# Patient Record
Sex: Female | Born: 1995 | Race: White | Hispanic: No | Marital: Married | Smoking: Never smoker
Health system: Southern US, Community
[De-identification: ages and names within clinical notes are randomized; demographics above are authoritative.]

## PROBLEM LIST (undated history)

## (undated) DIAGNOSIS — N809 Endometriosis, unspecified: Secondary | ICD-10-CM

---

## 2017-07-11 ENCOUNTER — Observation Stay (HOSPITAL_COMMUNITY)
Admission: EM | Admit: 2017-07-11 | Discharge: 2017-07-12 | Disposition: A | Discharge status: Home / Self Care | Payer: BLUE CROSS/BLUE SHIELD | Attending: Surgery | Admitting: Surgery

## 2017-07-11 ENCOUNTER — Observation Stay (HOSPITAL_COMMUNITY): Payer: BLUE CROSS/BLUE SHIELD | Admitting: Certified Registered Nurse Anesthetist

## 2017-07-11 ENCOUNTER — Encounter (HOSPITAL_COMMUNITY): Payer: Self-pay

## 2017-07-11 ENCOUNTER — Encounter (HOSPITAL_COMMUNITY)
Admission: EM | Disposition: A | Discharge status: Home / Self Care | Payer: Self-pay | Source: Home / Self Care | Attending: Emergency Medicine

## 2017-07-11 ENCOUNTER — Other Ambulatory Visit: Payer: Self-pay

## 2017-07-11 ENCOUNTER — Emergency Department (HOSPITAL_COMMUNITY): Payer: BLUE CROSS/BLUE SHIELD

## 2017-07-11 DIAGNOSIS — K35891 Other acute appendicitis without perforation, with gangrene: Principal | ICD-10-CM | POA: Insufficient documentation

## 2017-07-11 DIAGNOSIS — K3580 Unspecified acute appendicitis: Secondary | ICD-10-CM | POA: Diagnosis present

## 2017-07-11 DIAGNOSIS — K37 Unspecified appendicitis: Secondary | ICD-10-CM | POA: Diagnosis present

## 2017-07-11 DIAGNOSIS — R1012 Left upper quadrant pain: Secondary | ICD-10-CM | POA: Diagnosis present

## 2017-07-11 HISTORY — DX: Endometriosis, unspecified: N80.9

## 2017-07-11 HISTORY — PX: LAPAROSCOPIC APPENDECTOMY: SHX408

## 2017-07-11 LAB — CBC
HCT: 39.7 % (ref 36.0–46.0)
HEMOGLOBIN: 13.6 g/dL (ref 12.0–15.0)
MCH: 28.5 pg (ref 26.0–34.0)
MCHC: 34.3 g/dL (ref 30.0–36.0)
MCV: 83.1 fL (ref 78.0–100.0)
Platelets: 211 10*3/uL (ref 150–400)
RBC: 4.78 MIL/uL (ref 3.87–5.11)
RDW: 13 % (ref 11.5–15.5)
WBC: 17 10*3/uL — ABNORMAL HIGH (ref 4.0–10.5)

## 2017-07-11 LAB — URINALYSIS, ROUTINE W REFLEX MICROSCOPIC
Bilirubin Urine: NEGATIVE
GLUCOSE, UA: NEGATIVE mg/dL
HGB URINE DIPSTICK: NEGATIVE
Ketones, ur: 5 mg/dL — AB
NITRITE: NEGATIVE
Protein, ur: 30 mg/dL — AB
Specific Gravity, Urine: 1.029 (ref 1.005–1.030)
pH: 7 (ref 5.0–8.0)

## 2017-07-11 LAB — COMPREHENSIVE METABOLIC PANEL
ALBUMIN: 4.1 g/dL (ref 3.5–5.0)
ALK PHOS: 65 U/L (ref 38–126)
ALT: 14 U/L (ref 14–54)
ANION GAP: 12 (ref 5–15)
AST: 19 U/L (ref 15–41)
BILIRUBIN TOTAL: 0.9 mg/dL (ref 0.3–1.2)
BUN: 8 mg/dL (ref 6–20)
CALCIUM: 9.5 mg/dL (ref 8.9–10.3)
CO2: 22 mmol/L (ref 22–32)
Chloride: 103 mmol/L (ref 101–111)
Creatinine, Ser: 0.54 mg/dL (ref 0.44–1.00)
GFR calc Af Amer: >60 mL/min (ref >60)
GLUCOSE: 140 mg/dL — AB (ref 65–99)
Potassium: 4 mmol/L (ref 3.5–5.1)
Sodium: 137 mmol/L (ref 135–145)
TOTAL PROTEIN: 7.4 g/dL (ref 6.5–8.1)

## 2017-07-11 LAB — I-STAT BETA HCG BLOOD, ED (MC, WL, AP ONLY): I-stat hCG, quantitative: <5 m[IU]/mL (ref <5)

## 2017-07-11 LAB — LIPASE, BLOOD: Lipase: 32 U/L (ref 11–51)

## 2017-07-11 SURGERY — APPENDECTOMY, LAPAROSCOPIC
Anesthesia: General | Site: Abdomen

## 2017-07-11 MED ORDER — DEXAMETHASONE SODIUM PHOSPHATE 10 MG/ML IJ SOLN
INTRAMUSCULAR | Status: DC | PRN
  Administered 2017-07-11: 10 mg via INTRAVENOUS

## 2017-07-11 MED ORDER — HYDROMORPHONE HCL 1 MG/ML IJ SOLN: 0.2500 mg | INTRAMUSCULAR | Status: DC | PRN

## 2017-07-11 MED ORDER — MORPHINE SULFATE (PF) 4 MG/ML IV SOLN: 4.0000 mg | Freq: Once | INTRAVENOUS | Status: DC

## 2017-07-11 MED ORDER — MEPERIDINE HCL 50 MG/ML IJ SOLN: 6.2500 mg | INTRAMUSCULAR | Status: DC | PRN

## 2017-07-11 MED ORDER — MIDAZOLAM HCL 2 MG/2ML IJ SOLN
INTRAMUSCULAR | Status: AC
  Filled 2017-07-11: qty 2

## 2017-07-11 MED ORDER — HYDROMORPHONE HCL 1 MG/ML IJ SOLN: 0.5000 mg | INTRAMUSCULAR | Status: DC | PRN

## 2017-07-11 MED ORDER — DIPHENHYDRAMINE HCL 50 MG/ML IJ SOLN: 25.0000 mg | Freq: Four times a day (QID) | INTRAMUSCULAR | Status: DC | PRN

## 2017-07-11 MED ORDER — HEPARIN SODIUM (PORCINE) 5000 UNIT/ML IJ SOLN
5000.0000 [IU] | Freq: Three times a day (TID) | INTRAMUSCULAR | Status: DC
  Administered 2017-07-12: 5000 [IU] via SUBCUTANEOUS

## 2017-07-11 MED ORDER — SODIUM CHLORIDE 0.9 % IV BOLUS (SEPSIS)
1000.0000 mL | Freq: Once | INTRAVENOUS | Status: AC
  Administered 2017-07-11: 1000 mL via INTRAVENOUS

## 2017-07-11 MED ORDER — DIPHENHYDRAMINE HCL 25 MG PO CAPS: 25.0000 mg | ORAL_CAPSULE | Freq: Four times a day (QID) | ORAL | Status: DC | PRN

## 2017-07-11 MED ORDER — MORPHINE SULFATE (PF) 4 MG/ML IV SOLN
2.0000 mg | INTRAVENOUS | Status: DC | PRN
  Administered 2017-07-12: 2 mg via INTRAVENOUS
  Filled 2017-07-11 (×2): qty 1

## 2017-07-11 MED ORDER — SODIUM CHLORIDE 0.9 % IR SOLN
Status: DC | PRN
  Administered 2017-07-11: 1000 mL

## 2017-07-11 MED ORDER — ONDANSETRON HCL 4 MG/2ML IJ SOLN: 4.0000 mg | Freq: Four times a day (QID) | INTRAMUSCULAR | Status: DC | PRN

## 2017-07-11 MED ORDER — SCOPOLAMINE 1 MG/3DAYS TD PT72
MEDICATED_PATCH | TRANSDERMAL | Status: DC | PRN
  Administered 2017-07-11: 1 via TRANSDERMAL

## 2017-07-11 MED ORDER — METRONIDAZOLE IN NACL 5-0.79 MG/ML-% IV SOLN
500.0000 mg | Freq: Three times a day (TID) | INTRAVENOUS | Status: DC
  Administered 2017-07-11: 500 mg via INTRAVENOUS
  Filled 2017-07-11 (×3): qty 100

## 2017-07-11 MED ORDER — ONDANSETRON 4 MG PO TBDP: 4.0000 mg | ORAL_TABLET | Freq: Four times a day (QID) | ORAL | Status: DC | PRN

## 2017-07-11 MED ORDER — MIDAZOLAM HCL 5 MG/5ML IJ SOLN
INTRAMUSCULAR | Status: DC | PRN
  Administered 2017-07-11: 2 mg via INTRAVENOUS

## 2017-07-11 MED ORDER — DIPHENHYDRAMINE HCL 50 MG/ML IJ SOLN: 12.5000 mg | Freq: Four times a day (QID) | INTRAMUSCULAR | Status: DC | PRN

## 2017-07-11 MED ORDER — FENTANYL CITRATE (PF) 100 MCG/2ML IJ SOLN
50.0000 ug | Freq: Once | INTRAMUSCULAR | Status: AC
  Administered 2017-07-11: 50 ug via INTRAVENOUS
  Filled 2017-07-11: qty 2

## 2017-07-11 MED ORDER — ONDANSETRON HCL 4 MG/2ML IJ SOLN
4.0000 mg | Freq: Once | INTRAMUSCULAR | Status: AC
  Administered 2017-07-11: 4 mg via INTRAVENOUS
  Filled 2017-07-11: qty 2

## 2017-07-11 MED ORDER — OXYCODONE HCL 5 MG PO TABS
5.0000 mg | ORAL_TABLET | Freq: Four times a day (QID) | ORAL | Status: DC | PRN
  Administered 2017-07-11 – 2017-07-12 (×2): 5 mg via ORAL
  Filled 2017-07-11 (×3): qty 1

## 2017-07-11 MED ORDER — SODIUM CHLORIDE 0.9 % IV SOLN
2.0000 g | INTRAVENOUS | Status: DC
  Administered 2017-07-11: 2 g via INTRAVENOUS
  Filled 2017-07-11: qty 20

## 2017-07-11 MED ORDER — ACETAMINOPHEN 325 MG PO TABS
650.0000 mg | ORAL_TABLET | Freq: Four times a day (QID) | ORAL | Status: DC
  Administered 2017-07-11 – 2017-07-12 (×3): 650 mg via ORAL
  Filled 2017-07-11 (×3): qty 2

## 2017-07-11 MED ORDER — MIDAZOLAM HCL 2 MG/2ML IJ SOLN: 0.5000 mg | Freq: Once | INTRAMUSCULAR | Status: DC | PRN

## 2017-07-11 MED ORDER — SCOPOLAMINE 1 MG/3DAYS TD PT72: 1.0000 | MEDICATED_PATCH | Freq: Once | TRANSDERMAL | Status: DC

## 2017-07-11 MED ORDER — BUPIVACAINE-EPINEPHRINE 0.25% -1:200000 IJ SOLN
INTRAMUSCULAR | Status: AC
  Filled 2017-07-11: qty 1

## 2017-07-11 MED ORDER — LACTATED RINGERS IV SOLN
INTRAVENOUS | Status: DC
  Administered 2017-07-11 (×2): via INTRAVENOUS

## 2017-07-11 MED ORDER — FENTANYL CITRATE (PF) 100 MCG/2ML IJ SOLN
INTRAMUSCULAR | Status: DC | PRN
  Administered 2017-07-11 (×5): 50 ug via INTRAVENOUS

## 2017-07-11 MED ORDER — FENTANYL CITRATE (PF) 250 MCG/5ML IJ SOLN
INTRAMUSCULAR | Status: AC
  Filled 2017-07-11: qty 5

## 2017-07-11 MED ORDER — DOCUSATE SODIUM 100 MG PO CAPS
200.0000 mg | ORAL_CAPSULE | Freq: Two times a day (BID) | ORAL | Status: DC
  Administered 2017-07-11 – 2017-07-12 (×2): 200 mg via ORAL
  Filled 2017-07-11 (×2): qty 2

## 2017-07-11 MED ORDER — ONDANSETRON HCL 4 MG/2ML IJ SOLN
INTRAMUSCULAR | Status: DC | PRN
  Administered 2017-07-11: 4 mg via INTRAVENOUS

## 2017-07-11 MED ORDER — SUGAMMADEX SODIUM 200 MG/2ML IV SOLN
INTRAVENOUS | Status: DC | PRN
  Administered 2017-07-11: 200 mg via INTRAVENOUS

## 2017-07-11 MED ORDER — LIDOCAINE HCL (CARDIAC) 20 MG/ML IV SOLN
INTRAVENOUS | Status: DC | PRN
  Administered 2017-07-11: 30 mg via INTRAVENOUS

## 2017-07-11 MED ORDER — 0.9 % SODIUM CHLORIDE (POUR BTL) OPTIME
TOPICAL | Status: DC | PRN
  Administered 2017-07-11: 1000 mL

## 2017-07-11 MED ORDER — KCL-LACTATED RINGERS-D5W 20 MEQ/L IV SOLN
INTRAVENOUS | Status: DC
  Administered 2017-07-11: 13:00:00 via INTRAVENOUS
  Filled 2017-07-11: qty 1000

## 2017-07-11 MED ORDER — IBUPROFEN 600 MG PO TABS: 600.0000 mg | ORAL_TABLET | Freq: Four times a day (QID) | ORAL | Status: DC | PRN

## 2017-07-11 MED ORDER — DIPHENHYDRAMINE HCL 12.5 MG/5ML PO ELIX: 12.5000 mg | ORAL_SOLUTION | Freq: Four times a day (QID) | ORAL | Status: DC | PRN

## 2017-07-11 MED ORDER — BUPIVACAINE-EPINEPHRINE 0.25% -1:200000 IJ SOLN
INTRAMUSCULAR | Status: DC | PRN
  Administered 2017-07-11: 20 mL

## 2017-07-11 MED ORDER — ROCURONIUM BROMIDE 100 MG/10ML IV SOLN
INTRAVENOUS | Status: DC | PRN
  Administered 2017-07-11: 10 mg via INTRAVENOUS
  Administered 2017-07-11: 50 mg via INTRAVENOUS

## 2017-07-11 MED ORDER — ALBUTEROL SULFATE (2.5 MG/3ML) 0.083% IN NEBU: 3.0000 mL | INHALATION_SOLUTION | Freq: Four times a day (QID) | RESPIRATORY_TRACT | Status: DC | PRN

## 2017-07-11 MED ORDER — LACTATED RINGERS IV SOLN
INTRAVENOUS | Status: DC
  Administered 2017-07-11 (×2): via INTRAVENOUS

## 2017-07-11 MED ORDER — PROPOFOL 10 MG/ML IV BOLUS
INTRAVENOUS | Status: DC | PRN
  Administered 2017-07-11: 200 mg via INTRAVENOUS
  Administered 2017-07-11: 20 mg via INTRAVENOUS

## 2017-07-11 MED ORDER — DEXAMETHASONE SODIUM PHOSPHATE 10 MG/ML IJ SOLN
INTRAMUSCULAR | Status: AC
  Filled 2017-07-11: qty 1

## 2017-07-11 MED ORDER — SCOPOLAMINE 1 MG/3DAYS TD PT72
MEDICATED_PATCH | TRANSDERMAL | Status: AC
  Filled 2017-07-11: qty 2

## 2017-07-11 MED ORDER — LIDOCAINE 2% (20 MG/ML) 5 ML SYRINGE
INTRAMUSCULAR | Status: AC
  Filled 2017-07-11: qty 20

## 2017-07-11 MED ORDER — ONDANSETRON HCL 4 MG/2ML IJ SOLN
INTRAMUSCULAR | Status: AC
  Filled 2017-07-11: qty 2

## 2017-07-11 MED ORDER — ROCURONIUM BROMIDE 10 MG/ML (PF) SYRINGE
PREFILLED_SYRINGE | INTRAVENOUS | Status: AC
  Filled 2017-07-11: qty 20

## 2017-07-11 MED ORDER — SUCCINYLCHOLINE CHLORIDE 20 MG/ML IJ SOLN
INTRAMUSCULAR | Status: DC | PRN
  Administered 2017-07-11: 100 mg via INTRAVENOUS

## 2017-07-11 MED ORDER — PROMETHAZINE HCL 25 MG/ML IJ SOLN
6.2500 mg | INTRAMUSCULAR | Status: DC | PRN
Start: 2017-07-11 — End: 2017-07-11

## 2017-07-11 MED ORDER — IOPAMIDOL (ISOVUE-300) INJECTION 61%
INTRAVENOUS | Status: AC
  Administered 2017-07-11: 100 mL
  Filled 2017-07-11: qty 100

## 2017-07-11 SURGICAL SUPPLY — 44 items
APPLIER CLIP 5 13 M/L LIGAMAX5 (MISCELLANEOUS)
BLADE CLIPPER SURG (BLADE) IMPLANT
CANISTER SUCT 3000ML PPV (MISCELLANEOUS) ×3 IMPLANT
CHLORAPREP W/TINT 26ML (MISCELLANEOUS) ×3 IMPLANT
CLIP APPLIE 5 13 M/L LIGAMAX5 (MISCELLANEOUS) IMPLANT
COVER SURGICAL LIGHT HANDLE (MISCELLANEOUS) ×3 IMPLANT
CUTTER FLEX LINEAR 45M (STAPLE) ×3 IMPLANT
DERMABOND ADVANCED (GAUZE/BANDAGES/DRESSINGS) ×2
DERMABOND ADVANCED .7 DNX12 (GAUZE/BANDAGES/DRESSINGS) ×1 IMPLANT
ELECT REM PT RETURN 9FT ADLT (ELECTROSURGICAL) ×3
ELECTRODE REM PT RTRN 9FT ADLT (ELECTROSURGICAL) ×1 IMPLANT
GLOVE BIO SURGEON STRL SZ7.5 (GLOVE) ×3 IMPLANT
GLOVE BIOGEL PI IND STRL 6.5 (GLOVE) ×1 IMPLANT
GLOVE BIOGEL PI INDICATOR 6.5 (GLOVE) ×2
GLOVE INDICATOR 8.0 STRL GRN (GLOVE) ×3 IMPLANT
GLOVE SURG SS PI 6.5 STRL IVOR (GLOVE) ×3 IMPLANT
GOWN STRL REUS W/ TWL LRG LVL3 (GOWN DISPOSABLE) ×2 IMPLANT
GOWN STRL REUS W/ TWL XL LVL3 (GOWN DISPOSABLE) ×1 IMPLANT
GOWN STRL REUS W/TWL LRG LVL3 (GOWN DISPOSABLE) ×4
GOWN STRL REUS W/TWL XL LVL3 (GOWN DISPOSABLE) ×2
KIT BASIN OR (CUSTOM PROCEDURE TRAY) ×3 IMPLANT
KIT ROOM TURNOVER OR (KITS) ×3 IMPLANT
NEEDLE INSUFFLATION 14GA 120MM (NEEDLE) ×3 IMPLANT
NS IRRIG 1000ML POUR BTL (IV SOLUTION) ×3 IMPLANT
PAD ARMBOARD 7.5X6 YLW CONV (MISCELLANEOUS) ×6 IMPLANT
POUCH SPECIMEN RETRIEVAL 10MM (ENDOMECHANICALS) ×3 IMPLANT
RELOAD 45 VASCULAR/THIN (ENDOMECHANICALS) IMPLANT
RELOAD STAPLE TA45 3.5 REG BLU (ENDOMECHANICALS) ×3 IMPLANT
SCISSORS LAP 5X35 DISP (ENDOMECHANICALS) IMPLANT
SET IRRIG TUBING LAPAROSCOPIC (IRRIGATION / IRRIGATOR) ×3 IMPLANT
SHEARS HARMONIC ACE PLUS 36CM (ENDOMECHANICALS) ×3 IMPLANT
SLEEVE ENDOPATH XCEL 5M (ENDOMECHANICALS) ×3 IMPLANT
SPECIMEN JAR SMALL (MISCELLANEOUS) ×3 IMPLANT
SUT MNCRL AB 4-0 PS2 18 (SUTURE) ×6 IMPLANT
TOWEL OR 17X24 6PK STRL BLUE (TOWEL DISPOSABLE) ×3 IMPLANT
TOWEL OR 17X26 10 PK STRL BLUE (TOWEL DISPOSABLE) ×3 IMPLANT
TRAY FOLEY CATH SILVER 16FR (SET/KITS/TRAYS/PACK) ×3 IMPLANT
TRAY LAPAROSCOPIC MC (CUSTOM PROCEDURE TRAY) ×3 IMPLANT
TROCAR BLADELESS 12MM (ENDOMECHANICALS) ×3 IMPLANT
TROCAR BLADELESS 5MM (ENDOMECHANICALS) ×3 IMPLANT
TROCAR XCEL BLUNT TIP 100MML (ENDOMECHANICALS) ×3 IMPLANT
TROCAR XCEL NON-BLD 5MMX100MML (ENDOMECHANICALS) ×3 IMPLANT
TUBING INSUFFLATION (TUBING) ×3 IMPLANT
WATER STERILE IRR 1000ML POUR (IV SOLUTION) ×3 IMPLANT

## 2017-07-11 NOTE — Anesthesia Postprocedure Evaluation (Signed)
Anesthesia Post Note  Patient: Rebecca Dickerson  Procedure(s) Performed: APPENDECTOMY LAPAROSCOPIC (N/A Abdomen)     Patient location during evaluation: PACU Anesthesia Type: General Level of consciousness: sedated, patient cooperative and oriented Pain management: pain level controlled Vital Signs Assessment: post-procedure vital signs reviewed and stable Respiratory status: spontaneous breathing, nonlabored ventilation and respiratory function stable Cardiovascular status: blood pressure returned to baseline and stable Postop Assessment: no apparent nausea or vomiting Anesthetic complications: no    Last Vitals:  Vitals:   07/11/17 1630 07/11/17 1700  BP: 123/65 128/66  Pulse: 97 86  Resp: 18 18  Temp: (!) 36.4 C   SpO2: 97% 98%    Last Pain:  Vitals:   07/11/17 1302  TempSrc:   PainSc: 0-No pain                 Anise Harbin,E. Carrell Palmatier

## 2017-07-11 NOTE — Op Note (Signed)
Kelani Robart [id number]   PRE-OPERATIVE DIAGNOSIS:  ACUTE APPENDICITIS  POST-OPERATIVE DIAGNOSIS:   ACUTE APPENDICITIS   Procedure(s): APPENDECTOMY LAPAROSCOPIC  SURGEON:  Stephanie Coup. Sueellen Kayes, M.D.  ASSISTANTRose Phi  ANESTHESIA: General endotracheal  EBL:   5cc  DRAINS: None  SPECIMEN:  Appendix  COMPLICATIONS: None  COUNTS:  Sponge, needle and instrument counts were reported correct x2 at conclusion of the operation  DISPOSITION:  PACU in satisfactory condition  FINDINGS: Mildly inflamed appendix consistent with early acute appendicitis. Small volume free fluid in pelvis. Grossly normal ovaries and fallopian tubes and uterus. No other identifiable pathology.  INDICATIONS:  21yoF with 1d hx of RLQ pain - sharp, nonradiating. Never had this before. Associated with emesis. Denies f/c. Pain worse today than yesterday. On exam she had tenderness in her right lower quadrant with +Rovsing's sign. No rebound/guarding. WBC 17. CT Demonstrated findings consistent with early acute appendicitis.  I reviewed her CT scan with her and her partner. She does have a classic exam for appendicitis and WBC of 17 with CT showing possible early acute appendicitis but the appendix contains air which can go against appendicitis. Given the ovarian cyst and fluid nearby, it's also possible the source of her pain is from this. I had a long discussion with the patient, her partner and her partner's mom who is a PA at a neighboring emergency room. We discussed the possibility that this is appendicitis in an early form given the CT findings. We also discussed observation as an option but the potential for worsening of her condition if this is in fact appendicitis. After they discussed the options amongst themselves, she elected to proceed with laparoscopic appendectomy, all other indicated procedures.  The anatomy & physiology of GI tract was discussed.  The pathophysiology of appendicitis was  discussed. We discussed the possibility that this will not address her pain and that the plan would still be to remove the appendix.  I explained laparoscopic techniques with possible need for an open approach.   The planned procedure, material risks (including but not limited to pain, bleeding, infection, abscess, leak from staple line, injury to other organs/viscus, need for additional procedures, heart attack, stroke, death). Possibility that this will not correct all abdominal symptoms was explained.  Goals of post-operative recovery were discussed as well.  Her and her partner's questions were answered to their satisfaction.  The patient expressed understanding. She has elected to proceed with surgery.  DESCRIPTION:   The patient was identified & brought into the operating room. SCDs were in place and functioning. General endotracheal anesthesia was administered. Preoperative antibiotics were administered. The patient was positioned supine with left arm tucked. A foley catheter was inserted under sterile conditions. The abdomen was prepped and draped in the standard sterile fashion. A surgical timeout confirmed our plan.  A small incision was made in the infraumbilical fold. The subcutaneous tissue was dissected and the umbilical stalk identified. The stalk was grasped with a Kocher and retracted outwardly. The infraumbilical fascia was exposed and incised. She had a substantial amount of preperitoneal fat in the infraumbilical region making peritoneal entry difficult due to habitus and distance from fascia.  An 0 Vicryl purse-string suture was placed. Given this, a Varess needle was placed in the left upper quadrant at Palmer's point.  Peritoneal location was confirmed using the saline drop test and aspiration.  Insufflation commenced with CO2 to a maximum pressure of 15 mmHg.  At this was accomplished a 12 mm Optiview trocar was then  placed through the fascial incision in the infraumbilical position  between the pursestring suture.  This was done under direct visualization.  The laparoscope was then reinserted and the peritoneal cavity and inspection confirmed no evidence of trocar site or Veress needle insertion complications.  The patient was then positioned in Trendelenburg. Two additional ports were placed - one in left lower quadrant and another in the suprapubic midline. The patient was then placed in the left side down position.  The terminal ileum was gently mobilized to proximal ascending colon in a lateral to medial fashion. Care was taken to avoid injuring any retroperitoneal structures. The attachments to the appendix from the ascending colon and cecal mesentery were freed.  The appendix was elevated.  The base of the appendix was circumferentially dissected. The base was noted to be viable and healthy appearing.  The mesoappendix was then divided using a harmonic scalpel.  The appendix was then stapled with a blue load, taking a small healthy cuff of viable cecum. The appendix was placed in an EndoBag and extracted through the umbilical port site.   The right lower quadrant was irrigated. Hemostasis was noted to be achieved - taking time to inspect the ligated mesoappendix, colon mesentery, and retroperitoneum. Staple line was noted to be intact on the cecum with no bleeding. There was no perforation or injury. The right lower quadrant appeared clean and as such, no drain was placed.  The left lower quadrant and suprapubic ports were removed under direct visualization. The CO2 was exhausted from the abdomen. The umbilical fascia was then closed using 0 Vicryl suture. The skin of all port sites was then approximated using 4-0 Monocryl suture. The incisions were dressed with Dermabond.  The patient was then extubated and transferred to a stretcher for transport to recover in satisfactory condition.   Dragon Dictation was used to prepare this report. Any errors above in transcription are  unintentional.

## 2017-07-11 NOTE — ED Triage Notes (Addendum)
Patient complains of left upper abdominal pain that started at noon yesterday. Developed vomiting yesterday evening, no diarrhea. States that the pain now radiated to RLQ. Pale on arrival. States she has vomited all night

## 2017-07-11 NOTE — ED Notes (Signed)
Dr. Cliffton AstersWhite with surgery rounding at bedside

## 2017-07-11 NOTE — Anesthesia Procedure Notes (Signed)
Procedure Name: Intubation Date/Time: 07/11/2017 3:02 PM Performed by: Shirlyn Goltz, CRNA Pre-anesthesia Checklist: Patient identified, Emergency Drugs available, Suction available and Patient being monitored Patient Re-evaluated:Patient Re-evaluated prior to induction Oxygen Delivery Method: Circle system utilized Preoxygenation: Pre-oxygenation with 100% oxygen Induction Type: IV induction, Rapid sequence and Cricoid Pressure applied Laryngoscope Size: Mac and 3 Grade View: Grade I Tube type: Oral Tube size: 7.0 mm Number of attempts: 1 Airway Equipment and Method: Stylet Placement Confirmation: ETT inserted through vocal cords under direct vision,  positive ETCO2 and breath sounds checked- equal and bilateral Secured at: 22 cm Tube secured with: Tape Dental Injury: Teeth and Oropharynx as per pre-operative assessment

## 2017-07-11 NOTE — Progress Notes (Signed)
Patient arrived to 6n22 A&Ox4, VSS, IV intact in RH and infusing LR at 75 ml/hr.  Noted to have 4 lap sites with skin glue closure.  Rates pain 5/10.  Wife at bedside.  Patient and family oriented to room and equipment.  Will continue to monitor.

## 2017-07-11 NOTE — Transfer of Care (Signed)
Immediate Anesthesia Transfer of Care Note  Patient: Rebecca Dickerson  Procedure(s) Performed: APPENDECTOMY LAPAROSCOPIC (N/A Abdomen)  Patient Location: PACU  Anesthesia Type:General  Level of Consciousness: awake, alert , oriented and patient cooperative  Airway & Oxygen Therapy: Patient Spontanous Breathing  Post-op Assessment: Report given to RN and Post -op Vital signs reviewed and stable  Post vital signs: Reviewed and stable  Last Vitals:  Vitals:   07/11/17 1215 07/11/17 1630  BP: 122/83   Pulse: 89   Resp:    Temp:  (!) (P) 36.4 C  SpO2: 100%     Last Pain:  Vitals:   07/11/17 1302  TempSrc:   PainSc: 0-No pain         Complications: No apparent anesthesia complications

## 2017-07-11 NOTE — ED Provider Notes (Signed)
[facility] EMERGENCY DEPARTMENT Provider Note   CSN: [id number] Arrival date & time: 07/11/17  4098     History   Chief Complaint Chief Complaint  Patient presents with  . Abdominal Pain  . Emesis    HPI Rebecca Dickerson is a 22 y.o. female past medical history of endometriosis who presents for evaluation of abdominal pain that began yesterday.  Patient reports that initially, pain was in the left upper quadrant.  Patient reports that over the course of 24 hours, patient pain is radiated to the right lower quadrant.  Associated with nausea and vomiting.  Patient reports multiple episodes of nonbloody, nonbilious vomiting since onset of symptoms.  Patient has not had any diarrhea.  She does not recall when her last bowel movement was.  Patient reports that pain is worsened by movement.  She denies any other alleviating or aggravating factors.  She attempted to take some ibuprofen but states that she vomited.  Reports decreased p.o. secondary to symptoms.  Does not have a history of abdominal surgeries.  Denies any fevers, chest pain, difficulty breathing, dysuria, hematuria.  The history is provided by the patient.    Past Medical History:  Diagnosis Date  . Endometriosis     Patient Active Problem List   Diagnosis Date Noted  . Acute appendicitis 07/11/2017    History reviewed. No pertinent surgical history.  OB History    No data available       Home Medications    Prior to Admission medications   Medication Sig Start Date End Date Taking? Authorizing Provider  albuterol (PROVENTIL HFA;VENTOLIN HFA) 108 (90 Base) MCG/ACT inhaler Inhale 1 puff into the lungs every 6 (six) hours as needed for wheezing or shortness of breath.   Yes [provider]  ibuprofen (ADVIL,MOTRIN) 200 MG tablet Take 200 mg by mouth every 6 (six) hours as needed for moderate pain.   Yes [provider]  Multiple Vitamins-Minerals (AIRBORNE PO) Take 1 tablet by  mouth daily as needed (supplement).   Yes [provider]    Family History History reviewed. No pertinent family history.  Social History Social History   Tobacco Use  . Smoking status: Never Smoker  . Smokeless tobacco: Never Used  Substance Use Topics  . Alcohol use: Yes  . Drug use: No     Allergies   Nsaids   Review of Systems Review of Systems  Constitutional: Negative for chills and fever.  Eyes: Negative for visual disturbance.  Respiratory: Negative for cough and shortness of breath.   Cardiovascular: Negative for chest pain.  Gastrointestinal: Positive for abdominal pain and nausea. Negative for diarrhea and vomiting.  Genitourinary: Negative for dysuria and hematuria.  Musculoskeletal: Negative for back pain and neck pain.  Skin: Negative for rash.  Neurological: Negative for dizziness, weakness, numbness and headaches.  All other systems reviewed and are negative.    Physical Exam Updated Vital Signs BP 135/76   Pulse 72   Temp 98.8 F (37.1 C) (Oral)   Resp 18   SpO2 100%   Physical Exam  Constitutional: She is oriented to person, place, and time. She appears well-developed and well-nourished.  Appears uncomfortable but no acute distress   HENT:  Head: Normocephalic and atraumatic.  Mouth/Throat: Oropharynx is clear and moist and mucous membranes are normal.  Eyes: Conjunctivae, EOM and lids are normal. Pupils are equal, round, and reactive to light.  Neck: Full passive range of motion without pain.  Cardiovascular: Normal  rate, regular rhythm, normal heart sounds and normal pulses. Exam reveals no gallop and no friction rub.  No murmur heard. Pulmonary/Chest: Effort normal and breath sounds normal.  No evidence of respiratory distress. Able to speak in full sentences without difficulty.  Abdominal: Soft. Normal appearance. There is tenderness in the right lower quadrant and left upper quadrant. There is tenderness at McBurney's point.  There is no rigidity, no guarding and no CVA tenderness.  Abdomen is soft, non-distended. Tenderness noted to the LUQ and RLQ. Positive Rosving's. No CVA tenderness bilatearlly.   Musculoskeletal: Normal range of motion.  Neurological: She is alert and oriented to person, place, and time.  Skin: Skin is warm and dry. Capillary refill takes less than 2 seconds.  Psychiatric: She has a normal mood and affect. Her speech is normal.  Nursing note and vitals reviewed.    ED Treatments / Results  Labs (all labs ordered are listed, but only abnormal results are displayed) Labs Reviewed  COMPREHENSIVE METABOLIC PANEL - Abnormal; Notable for the following components:      Result Value   Glucose, Bld 140 (*)    All other components within normal limits  CBC - Abnormal; Notable for the following components:   WBC 17.0 (*)    All other components within normal limits  URINALYSIS, ROUTINE W REFLEX MICROSCOPIC - Abnormal; Notable for the following components:   APPearance HAZY (*)    Ketones, ur 5 (*)    Protein, ur 30 (*)    Leukocytes, UA TRACE (*)    Bacteria, UA FEW (*)    Squamous Epithelial / LPF 0-5 (*)    All other components within normal limits  LIPASE, BLOOD  HIV ANTIBODY (ROUTINE TESTING)  I-STAT BETA HCG BLOOD, ED ([facility], [facility], [facility] ONLY)    EKG  EKG Interpretation None       Radiology Ct Abdomen Pelvis W Contrast  Result Date: 07/11/2017 CLINICAL DATA:  Left side abdominal pain, now right lower quadrant abdominal pain. EXAM: CT ABDOMEN AND PELVIS WITH CONTRAST TECHNIQUE: Multidetector CT imaging of the abdomen and pelvis was performed using the standard protocol following bolus administration of intravenous contrast. CONTRAST:  ISOVUE-300 IOPAMIDOL (ISOVUE-300) INJECTION 61% COMPARISON:  None. FINDINGS: Lower chest: Lung bases are clear. No effusions. Heart is normal size. Hepatobiliary: No focal hepatic abnormality. Gallbladder unremarkable. Pancreas: No focal abnormality  or ductal dilatation. Spleen: No focal abnormality.  Normal size. Adrenals/Urinary Tract: No adrenal abnormality. No focal renal abnormality. No stones or hydronephrosis. Urinary bladder is unremarkable. Stomach/Bowel: There is gas noted within the appendix which is typically a normal finding, but the appendix is mildly dilated at 13 mm and there appears to be early surrounding inflammation concerning for early acute appendicitis. Stomach, large and small bowel grossly unremarkable. Vascular/Lymphatic: No evidence of aneurysm or adenopathy. Incidentally noted is a circumaortic left renal vein. Reproductive: Uterus and adnexa unremarkable. No mass. Small collapsing right ovarian cyst or follicle. Other: No free fluid or free air. Musculoskeletal: No acute bony abnormality. IMPRESSION: While there is air within the appendix, the appendix is mildly dilated and there appears to be early surrounding inflammation/stranding. Findings concerning for early acute appendicitis. Electronically Signed   By: Charlett Nose M.D.   On: 07/11/2017 11:43    Procedures Procedures (including critical care time)  Medications Ordered in ED Medications  dextrose 5% in lactated ringers with KCl 20 mEq/L infusion ( Intravenous New Bag/Given 07/11/17 1310)  cefTRIAXone (ROCEPHIN) 2 g in sodium chloride 0.9 %  100 mL IVPB (2 g Intravenous New Bag/Given 07/11/17 1255)    And  metroNIDAZOLE (FLAGYL) IVPB 500 mg (500 mg Intravenous New Bag/Given 07/11/17 1258)  morphine 4 MG/ML injection 2-3 mg (not administered)  diphenhydrAMINE (BENADRYL) capsule 25 mg (not administered)    Or  diphenhydrAMINE (BENADRYL) injection 25 mg (not administered)  ondansetron (ZOFRAN-ODT) disintegrating tablet 4 mg (not administered)    Or  ondansetron (ZOFRAN) injection 4 mg (not administered)  sodium chloride 0.9 % bolus 1,000 mL (1,000 mLs Intravenous New Bag/Given 07/11/17 1111)  ondansetron (ZOFRAN) injection 4 mg (4 mg Intravenous Given 07/11/17 1130)    iopamidol (ISOVUE-300) 61 % injection (100 mLs  Contrast Given 07/11/17 1130)  fentaNYL (SUBLIMAZE) injection 50 mcg (50 mcg Intravenous Given 07/11/17 1130)     Initial Impression / Assessment and Plan / ED Course  I have reviewed the triage vital signs and the nursing notes.  Pertinent labs & imaging results that were available during my care of the patient were reviewed by me and considered in my medical decision making (see chart for details).     22 year old female who presents for evaluation of abdominal pain and nausea/vomiting that began yesterday.  No fevers, chest pain, difficulty breathing. Patient is afebrile, non-toxic appearing, sitting comfortably on examination table. Vital signs reviewed and stable.  On exam, patient does have tenderness to the left upper quadrant and right lower quadrant.  No rigidity, guarding.  No CVA tenderness bilaterally.  Consider appendicitis versus viral GI versus GU etiology.  Do not suspect ovarian torsion given history/physical exam.  History/physical exam not concerning for pyelonephritis, kidney stone.  Initial labs ordered at triage.  Plan for CT abdomen pelvis for further evaluation.  I-STAT beta negative.  UA shows some small amount of ketones, trace leukocytes.  Lipase unremarkable.  CMP unremarkable.  CBC shows leukocytosis of 17.0.  Otherwise unremarkable.  CT abdomen pelvis is concerning for inflammatory changes around the appendix consistent with early appendicitis.  Given findings, will plan to consult general surgery.  Discussed patient with Fayrene FearingJames (Gen Surg PA). Will come to the ED to evaluate.   Surgery will plan to admit for acute appendicitis. Abx started in the ED. Plan for admission to gen surg.   Final Clinical Impressions(s) / ED Diagnoses   Final diagnoses:  Acute appendicitis, unspecified acute appendicitis type    ED Discharge Orders    None       Maxwell CaulLayden, Mannat Benedetti A, PA-C 07/11/17 1320    Mancel BaleWentz, Elliott,  MD 07/11/17 2228

## 2017-07-11 NOTE — Anesthesia Preprocedure Evaluation (Addendum)
Anesthesia Evaluation  Patient identified by MRN, date of birth, ID band Patient awake    Reviewed: Allergy & Precautions, NPO status , Patient's Chart, lab work & pertinent test results  History of Anesthesia Complications Negative for: history of anesthetic complications  Airway Mallampati: II  TM Distance: >3 FB Neck ROM: Full    Dental  (+) Dental Advisory Given   Pulmonary neg pulmonary ROS,    breath sounds clear to auscultation       Cardiovascular negative cardio ROS   Rhythm:Regular Rate:Normal     Neuro/Psych negative neurological ROS     GI/Hepatic Neg liver ROS, Acute appy: vomiting   Endo/Other  negative endocrine ROS  Renal/GU negative Renal ROS     Musculoskeletal   Abdominal (+) + obese,   Peds  Hematology negative hematology ROS (+)   Anesthesia Other Findings   Reproductive/Obstetrics LMP 2 weeks ago                            Anesthesia Physical Anesthesia Plan  ASA: II  Anesthesia Plan: General   Post-op Pain Management:    Induction: Intravenous and Rapid sequence  PONV Risk Score and Plan: 3 and Ondansetron, Dexamethasone and Scopolamine patch - Pre-op  Airway Management Planned: Oral ETT  Additional Equipment:   Intra-op Plan:   Post-operative Plan: Extubation in OR  Informed Consent: I have reviewed the patients History and Physical, chart, labs and discussed the procedure including the risks, benefits and alternatives for the proposed anesthesia with the patient or authorized representative who has indicated his/her understanding and acceptance.   Dental advisory given  Plan Discussed with: CRNA and Surgeon  Anesthesia Plan Comments: (Plan routine monitors, GETA)        Anesthesia Quick Evaluation

## 2017-07-11 NOTE — Discharge Instructions (Signed)
Laparoscopic Appendectomy, Adult, Care After °These instructions give you information about caring for yourself after your procedure. Your doctor may also give you more specific instructions. Call your doctor if you have any problems or questions after your procedure. °Follow these instructions at home: °Medicines °· Take over-the-counter and prescription medicines only as told by your doctor. °· Do not drive for 24 hours if you received a sedative. °· Do not drive or use heavy machinery while taking prescription pain medicine. °· If you were prescribed an antibiotic medicine, take it as told by your doctor. Do not stop taking it even if you start to feel better. °Activity °· Do not lift anything that is heavier than 10 pounds (4.5 kg) for 3 weeks or as told by your doctor. °· Do not play contact sports for 3 weeks or as told by your doctor. °· Slowly return to your normal activities. °Bathing °· Keep your cuts from surgery (incisions) clean and dry. °? Gently wash the cuts with soap and water. °? Rinse the cuts with water until the soap is gone. °? Pat the cuts dry with a clean towel. Do not rub the cuts. °· You may take showers after 48 hours. °· Do not take baths, swim, or use a hot tub for 2 weeks or as told by your doctor. °Cut Care °· Follow instructions from your doctor about how to take care of your cuts. Make sure you: °? Wash your hands with soap and water before you change your bandage (dressing). If you do not have soap and water, use hand sanitizer. °? Change your bandage as told by your doctor. °? Leave stitches (sutures), skin glue, or skin tape (adhesive) strips in place. They may need to stay in place for 2 weeks or longer. If tape strips get loose and curl up, you may trim the loose edges. Do not remove tape strips completely unless your doctor says it is okay. °· Check your cuts every day for signs of infection. Check for: °? More redness, swelling, or pain. °? More fluid or  blood. °? Warmth. °? Pus or a bad smell. °Other Instructions °· If you were sent home with a drain, follow instructions from your doctor about how to use it and care for it. °· Take deep breaths. This helps to keep your lungs from getting swollen (inflamed). °· To help with constipation: °? Drink plenty of fluids. °? Eat plenty of fruits and vegetables. °· Keep all follow-up visits as told by your doctor. This is important. °Contact a doctor if: °· You have more redness, swelling, or pain around a cut from surgery. °· You have more fluid or blood coming from a cut. °· Your cut feels warm to the touch. °· You have pus or a bad smell coming from a cut or a bandage. °· The edges of a cut break open after the stitches have been taken out. °· You have pain in your shoulders that gets worse. °· You feel dizzy or you pass out (faint). °· You have shortness of breath. °· You keep feeling sick to your stomach (nauseous). °· You keep throwing up (vomiting). °· You get diarrhea or you cannot control your poop. °· You lose your appetite. °· You have swelling or pain in your legs. °Get help right away if: °· You have a fever. °· You get a rash. °· You have trouble breathing. °· You have sharp pains in your chest. °This information is not intended to replace advice given   to you by your health care provider. Make sure you discuss any questions you have with your health care provider. °Document Released: 02/18/2009 Document Revised: 09/30/2015 Document Reviewed: 10/12/2014 °Elsevier Interactive Patient Education © 2018 [employer] ° °CCS ______[facility] °LAPAROSCOPIC SURGERY: POST OP INSTRUCTIONS °Always review your discharge instruction sheet given to you by the facility where your surgery was performed. °IF YOU HAVE DISABILITY OR FAMILY LEAVE FORMS, YOU MUST BRING THEM TO THE OFFICE FOR PROCESSING.   °DO NOT GIVE THEM TO YOUR DOCTOR. ° °1. A prescription for pain medication may be given to you upon  discharge.  Take your pain medication as prescribed, if needed.  If narcotic pain medicine is not needed, then you may take acetaminophen (Tylenol) or ibuprofen (Advil) as needed. °2. Take your usually prescribed medications unless otherwise directed. °3. If you need a refill on your pain medication, please contact your pharmacy.  They will contact our office to request authorization. Prescriptions will not be filled after 5pm or on week-ends. °4. You should follow a light diet the first few days after arrival home, such as soup and crackers, etc.  Be sure to include lots of fluids daily. °5. Most patients will experience some swelling and bruising in the area of the incisions.  Ice packs will help.  Swelling and bruising can take several days to resolve.  °6. It is common to experience some constipation if taking pain medication after surgery.  Increasing fluid intake and taking a stool softener (such as Colace) will usually help or prevent this problem from occurring.  A mild laxative (Milk of Magnesia or Miralax) should be taken according to package instructions if there are no bowel movements after 48 hours. °7. Unless discharge instructions indicate otherwise, you may remove your bandages 24-48 hours after surgery, and you may shower at that time.  You may have steri-strips (small skin tapes) in place directly over the incision.  These strips should be left on the skin for 7-10 days.  If your surgeon used skin glue on the incision, you may shower in 24 hours.  The glue will flake off over the next 2-3 weeks.  Any sutures or staples will be removed at the office during your follow-up visit. °8. ACTIVITIES:  You may resume regular (light) daily activities beginning the next day--such as daily self-care, walking, climbing stairs--gradually increasing activities as tolerated.  You may have sexual intercourse when it is comfortable.  Refrain from any heavy lifting or straining until approved by your doctor. °a. You  may drive when you are no longer taking prescription pain medication, you can comfortably wear a seatbelt, and you can safely maneuver your car and apply brakes. °b. RETURN TO WORK:  __________________________________________________________ °9. You should see your doctor in the office for a follow-up appointment approximately 2-3 weeks after your surgery.  Make sure that you call for this appointment within a day or two after you arrive home to insure a convenient appointment time. °10. OTHER INSTRUCTIONS: __________________________________________________________________________________________________________________________ __________________________________________________________________________________________________________________________ °WHEN TO CALL YOUR DOCTOR: °1. Fever over 101.0 °2. Inability to urinate °3. Continued bleeding from incision. °4. Increased pain, redness, or drainage from the incision. °5. Increasing abdominal pain ° °The clinic staff is available to answer your questions during regular business hours.  Please don’t hesitate to call and ask to speak to one of the nurses for clinical concerns.  If you have a medical emergency, go to the nearest emergency room or call 911.  A surgeon from   [facility] is always on call at the hospital. °[address], [address], [place], [place]  [address] ? [address] [address], [place], [place]   [address] °[phone] ? [phone] ? FAX [phone] °Web site: [email or web address] ° °

## 2017-07-11 NOTE — H&P (Addendum)
Rebecca Dickerson is an 22 y.o. female.   Chief Complaint: Abdominal pain   HPI: Patient is a 22 year old female who presented to the ED this morning complaining of initially left upper abdominal pain that started around noon yesterday.  She developed vomiting in the evening, no nausea she just starts vomiting.  She reports she has been vomiting all night.  Currently pain is in the right lower quadrant.  She has not had any diarrhea.  Nothing makes it better moving makes it worse.  She did try alleviating symptoms with ibuprofen which she promptly threw up.  Workup in the ED: She is afebrile vital signs are stable.  White count is 17,000, hemoglobin 13 hematocrit 39, platelets 211,000.  Glucose is 143 remainder of the CMP is normal.  Urinalysis shows hazy color urine.  Few bacteria nitrates -0-5 white cells per high-powered field.  HCG is less than 5.0.  CT scan shows there is gas noted within the appendix which is typically abnormal finding.  But the appendix is dilated to 13 mm and there appears to be surrounding inflammation concerning for early appendicitis.  The remainder of the exam was unremarkable.  The uterus and adnexa were unremarkable there were no masses small collapsing right ovarian cyst or follicle.  We are asked to see.   Past Medical History:  Diagnosis Date  . Endometriosis     History reviewed. No pertinent surgical history.  No family history on file. Social History:  reports that  has never smoked. she has never used smokeless tobacco. She reports that she drinks alcohol. She reports that she does not use drugs.  Allergies:  Allergies  Allergen Reactions  . Nsaids Other (See Comments)    Ulcers occur if pt ingests large amounts   Tobacco:  None ETOH:  Social Drugs:  None Married, full time Ship broker, works at the [employer], starting as EMS   Prior to Admission medications   Medication Sig Start Date End Date Taking? Authorizing Provider  albuterol (PROVENTIL  HFA;VENTOLIN HFA) 108 (90 Base) MCG/ACT inhaler Inhale 1 puff into the lungs every 6 (six) hours as needed for wheezing or shortness of breath.   Yes [provider]  ibuprofen (ADVIL,MOTRIN) 200 MG tablet Take 200 mg by mouth every 6 (six) hours as needed for moderate pain.   Yes [provider]  Multiple Vitamins-Minerals (AIRBORNE PO) Take 1 tablet by mouth daily as needed (supplement).   Yes [provider]     Results for orders placed or performed during the hospital encounter of 07/11/17 (from the past 48 hour(s))  Lipase, blood     Status: None   Collection Time: 07/11/17  8:35 AM  Result Value Ref Range   Lipase 32 11 - 51 U/L    Comment: Performed at [facility], [place] [address], [place], [place] [address]  Comprehensive metabolic panel     Status: Abnormal   Collection Time: 07/11/17  8:35 AM  Result Value Ref Range   Sodium 137 135 - 145 mmol/L   Potassium 4.0 3.5 - 5.1 mmol/L   Chloride 103 101 - 111 mmol/L   CO2 22 22 - 32 mmol/L   Glucose, Bld 140 (H) 65 - 99 mg/dL   BUN 8 6 - 20 mg/dL   Creatinine, Ser 0.54 0.44 - 1.00 mg/dL   Calcium 9.5 8.9 - 10.3 mg/dL   Total Protein 7.4 6.5 - 8.1 g/dL   Albumin 4.1 3.5 - 5.0 g/dL   AST  19 15 - 41 U/L   ALT 14 14 - 54 U/L   Alkaline Phosphatase 65 38 - 126 U/L   Total Bilirubin 0.9 0.3 - 1.2 mg/dL   GFR calc non Af Amer >60 >60 mL/min   GFR calc Af Amer >60 >60 mL/min    Comment: (NOTE) The eGFR has been calculated using the CKD EPI equation. This calculation has not been validated in all clinical situations. eGFR's persistently <60 mL/min signify possible Chronic Kidney Disease.    Anion gap 12 5 - 15    Comment: Performed at [facility] [address], [place], [place] [address]  CBC     Status: Abnormal   Collection Time: 07/11/17  8:35 AM  Result Value Ref Range   WBC 17.0 (H) 4.0 - 10.5 K/uL   RBC 4.78 3.87 - 5.11 MIL/uL   Hemoglobin 13.6 12.0 - 15.0 g/dL   HCT 39.7 36.0 -  46.0 %   MCV 83.1 78.0 - 100.0 fL   MCH 28.5 26.0 - 34.0 pg   MCHC 34.3 30.0 - 36.0 g/dL   RDW 13.0 11.5 - 15.5 %   Platelets 211 150 - 400 K/uL    Comment: Performed at [facility], [place] [address], [place], [place] [address]  Urinalysis, Routine w reflex microscopic     Status: Abnormal   Collection Time: 07/11/17  8:42 AM  Result Value Ref Range   Color, Urine YELLOW YELLOW   APPearance HAZY (A) CLEAR   Specific Gravity, Urine 1.029 1.005 - 1.030   pH 7.0 5.0 - 8.0   Glucose, UA NEGATIVE NEGATIVE mg/dL   Hgb urine dipstick NEGATIVE NEGATIVE   Bilirubin Urine NEGATIVE NEGATIVE   Ketones, ur 5 (A) NEGATIVE mg/dL   Protein, ur 30 (A) NEGATIVE mg/dL   Nitrite NEGATIVE NEGATIVE   Leukocytes, UA TRACE (A) NEGATIVE   RBC / HPF 0-5 0 - 5 RBC/hpf   WBC, UA 0-5 0 - 5 WBC/hpf   Bacteria, UA FEW (A) NONE SEEN   Squamous Epithelial / LPF 0-5 (A) NONE SEEN   Mucus PRESENT     Comment: Performed at [facility], [place] [address] [place], [place] [address]  I-Stat beta hCG blood, ED     Status: None   Collection Time: 07/11/17  8:56 AM  Result Value Ref Range   I-stat hCG, quantitative <5.0 <5 mIU/mL   Comment 3            Comment:   GEST. AGE      CONC.  (mIU/mL)   <=1 WEEK        5 - 50     2 WEEKS       50 - 500     3 WEEKS       100 - 10,000     4 WEEKS     1,000 - 30,000        FEMALE AND NON-PREGNANT FEMALE:     LESS THAN 5 mIU/mL    Ct Abdomen Pelvis W Contrast  Result Date: 07/11/2017 CLINICAL DATA:  Left side abdominal pain, now right lower quadrant abdominal pain. EXAM: CT ABDOMEN AND PELVIS WITH CONTRAST TECHNIQUE: Multidetector CT imaging of the abdomen and pelvis was performed using the standard protocol following bolus administration of intravenous contrast. CONTRAST:  121m ISOVUE-300 IOPAMIDOL (ISOVUE-300) INJECTION 61% COMPARISON:  None. FINDINGS: Lower chest: Lung bases are clear. No effusions. Heart is normal size. Hepatobiliary: No focal hepatic  abnormality.  Gallbladder unremarkable. Pancreas: No focal abnormality or ductal dilatation. Spleen: No focal abnormality.  Normal size. Adrenals/Urinary Tract: No adrenal abnormality. No focal renal abnormality. No stones or hydronephrosis. Urinary bladder is unremarkable. Stomach/Bowel: There is gas noted within the appendix which is typically a normal finding, but the appendix is mildly dilated at 13 mm and there appears to be early surrounding inflammation concerning for early acute appendicitis. Stomach, large and small bowel grossly unremarkable. Vascular/Lymphatic: No evidence of aneurysm or adenopathy. Incidentally noted is a circumaortic left renal vein. Reproductive: Uterus and adnexa unremarkable. No mass. Small collapsing right ovarian cyst or follicle. Other: No free fluid or free air. Musculoskeletal: No acute bony abnormality. IMPRESSION: While there is air within the appendix, the appendix is mildly dilated and there appears to be early surrounding inflammation/stranding. Findings concerning for early acute appendicitis. Electronically Signed   By: Rolm Baptise M.D.   On: 07/11/2017 11:43    Review of Systems  Constitutional: Negative for chills, diaphoresis, fever, malaise/fatigue and weight loss.  HENT: Negative.   Eyes: Negative.   Respiratory: Negative.   Cardiovascular: Negative.   Gastrointestinal: Positive for abdominal pain and vomiting. Negative for blood in stool, constipation, diarrhea, heartburn, melena and nausea.  Genitourinary: Negative.   Musculoskeletal: Negative.   Skin: Negative.   Neurological: Negative.  Negative for weakness.  Endo/Heme/Allergies: Negative.   Psychiatric/Behavioral: Negative.     Blood pressure 131/81, pulse 70, temperature 98.8 F (37.1 C), temperature source Oral, resp. rate 18, SpO2 100 %. Physical Exam  Constitutional: She is oriented to person, place, and time. She appears well-developed and well-nourished. No distress.  HENT:  Head: Normocephalic  and atraumatic.  Mouth/Throat: No oropharyngeal exudate.  Eyes: Right eye exhibits no discharge. Left eye exhibits no discharge. No scleral icterus.  Pupils are equal  Neck: Normal range of motion. Neck supple. No JVD present. No tracheal deviation present. No thyromegaly present.  Cardiovascular: Normal rate, regular rhythm, normal heart sounds and intact distal pulses.  No murmur heard. Respiratory: Effort normal and breath sounds normal. No respiratory distress. She has no wheezes. She has no rales. She exhibits no tenderness.  GI: Soft. She exhibits no distension and no mass. There is tenderness (pain was mid epigastric, but now in RLQ). There is no rebound and no guarding.  Musculoskeletal: She exhibits no edema or tenderness.  Lymphadenopathy:    She has no cervical adenopathy.  Neurological: She is alert and oriented to person, place, and time. No cranial nerve deficit.  Skin: Skin is warm and dry. No rash noted. She is not diaphoretic. No erythema. No pallor.  Psychiatric: She has a normal mood and affect. Her behavior is normal. Judgment and thought content normal.     Assessment/Plan Acute appendicitis Hx of endometriosis Hx of asthma with tobacco smoke exposure  Plan:  Admit to Observation, and take to OR today.  IV antibiotics, fluid hydration.    Koi Zangara, PA-C 07/11/2017, 11:59 AM

## 2017-07-12 ENCOUNTER — Encounter (HOSPITAL_COMMUNITY): Payer: Self-pay | Admitting: Surgery

## 2017-07-12 ENCOUNTER — Other Ambulatory Visit: Payer: Self-pay

## 2017-07-12 LAB — CBC WITH DIFFERENTIAL/PLATELET
BASOS ABS: 0 10*3/uL (ref 0.0–0.1)
BASOS PCT: 0 %
EOS ABS: 0 10*3/uL (ref 0.0–0.7)
Eosinophils Relative: 0 %
HCT: 35.9 % — ABNORMAL LOW (ref 36.0–46.0)
Hemoglobin: 11.8 g/dL — ABNORMAL LOW (ref 12.0–15.0)
Lymphocytes Relative: 13 %
Lymphs Abs: 1.6 10*3/uL (ref 0.7–4.0)
MCH: 28.1 pg (ref 26.0–34.0)
MCHC: 32.9 g/dL (ref 30.0–36.0)
MCV: 85.5 fL (ref 78.0–100.0)
MONO ABS: 0.5 10*3/uL (ref 0.1–1.0)
MONOS PCT: 4 %
NEUTROS ABS: 9.9 10*3/uL — AB (ref 1.7–7.7)
Neutrophils Relative %: 83 %
PLATELETS: 199 10*3/uL (ref 150–400)
RBC: 4.2 MIL/uL (ref 3.87–5.11)
RDW: 13.2 % (ref 11.5–15.5)
WBC: 11.9 10*3/uL — ABNORMAL HIGH (ref 4.0–10.5)

## 2017-07-12 LAB — HIV ANTIBODY (ROUTINE TESTING W REFLEX): HIV SCREEN 4TH GENERATION: NONREACTIVE

## 2017-07-12 MED ORDER — OXYCODONE HCL 5 MG PO TABS: 5.0000 mg | ORAL_TABLET | Freq: Four times a day (QID) | ORAL | 0 refills | Status: AC | PRN

## 2017-07-12 NOTE — Discharge Summary (Signed)
     Patient ID: Gennaro AfricaCassie Pezzullo [id number] 09/18/1995 22 y.o.  Admit date: 07/11/2017 Discharge date: 07/12/2017  Admitting Diagnosis: Acute appendicitis  Discharge Diagnosis Patient Active Problem List   Diagnosis Date Noted  . Acute appendicitis 07/11/2017  . Appendicitis 07/11/2017    Consultants None  Reason for Admission: 22 [facility] pain that started the previous day to admission.  She then developed vomiting.  She presented to the ED where she was found to have appendicitis.  She was then admitted.   Procedures Lap appy by Dr. Cliffton AstersWhite on 07-11-17  Hospital Course:  The patient was admitted and underwent a laparoscopic appendectomy.  The patient tolerated the procedure well.  On POD 1, the patient was tolerating a regular diet, voiding well, mobilizing, and pain was controlled with oral pain medications.  The patient was stable for DC home at this time with appropriate follow up made.    Physical Exam: Abd: soft, appropriately tender, all 4 incisions are c/d/i with dermabond.  No erythema or drainage.  Allergies as of 07/12/2017      Reactions   Nsaids Other (See Comments)   Ulcers occur if pt ingests large amounts      Medication List    TAKE these medications   AIRBORNE PO Take 1 tablet by mouth daily as needed (supplement).   albuterol 108 (90 Base) MCG/ACT inhaler Commonly known as:  PROVENTIL HFA;VENTOLIN HFA Inhale 1 puff into the lungs every 6 (six) hours as needed for wheezing or shortness of breath.   ibuprofen 200 MG tablet Commonly known as:  ADVIL,MOTRIN Take 200 mg by mouth every 6 (six) hours as needed for moderate pain.   oxyCODONE 5 MG immediate release tablet Commonly known as:  Oxy IR/ROXICODONE Take 1 tablet (5 mg total) by mouth every 6 (six) hours as needed for moderate pain (pain not controlled with tylenol/ibuprofen).        Follow-up Information    [facility]   Specialty:   General Surgery Why:  Office will call you with your follow up appointment.   Be at the office 30 minutes early for check in. Bring photo ID and insurance information. Contact information: [address] [place] [address] [phone]           Signed: Barnetta ChapelKelly Rupa Lagan, [facility] 07/12/2017, 8:31 AM Pager: [phone] Consults: [phone] Mon-Fri 7:00 am-4:30 pm Sat-Sun 7:00 am-11:30 am

## 2017-07-12 NOTE — Progress Notes (Signed)
Pt was discharged to go home.  She tolerated breakfast well and oral  pain medicine was effective.  Discharge instructions given.

## 2019-06-19 IMAGING — CT CT ABD-PELV W/ CM
2 of 4 series · 16 of 46 positions shown, 18 images · IV contrast (iopamidol)
Comparison: None.

CLINICAL DATA: Left side abdominal pain, now right lower quadrant
abdominal pain.

EXAM:
CT ABDOMEN AND PELVIS WITH CONTRAST
TECHNIQUE: Multidetector CT imaging of the abdomen and pelvis was performed
using the standard protocol following bolus administration of
intravenous contrast.
CONTRAST:  100mL QA5O4W-N00 IOPAMIDOL (QA5O4W-N00) INJECTION 61%

[Series 3: a/p w/ 5mm · axial · 0.83mm/px · z∈[+897,+1337]mm · 13 of 98 slices shown, 15 images]
[im 5/98  soft-tissue]
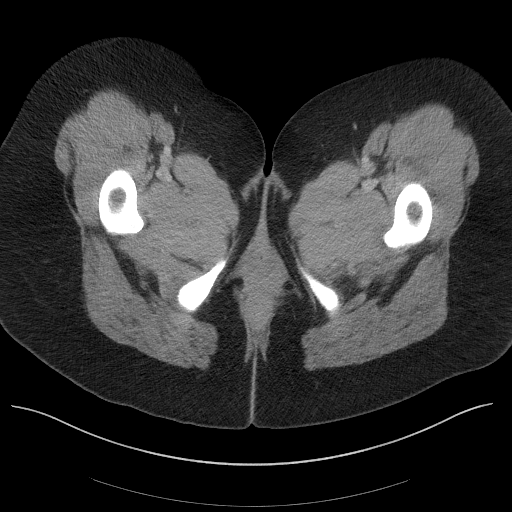
[im 5/98  bone]
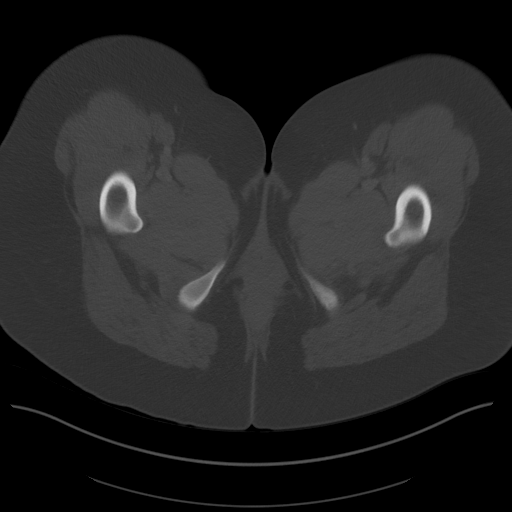
[im 13/98  soft-tissue]
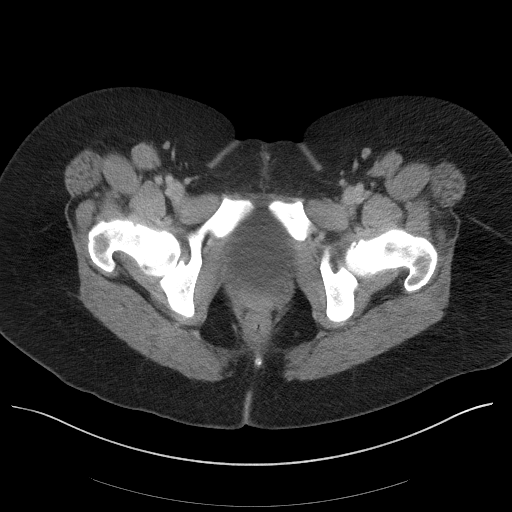
[im 22/98  soft-tissue]
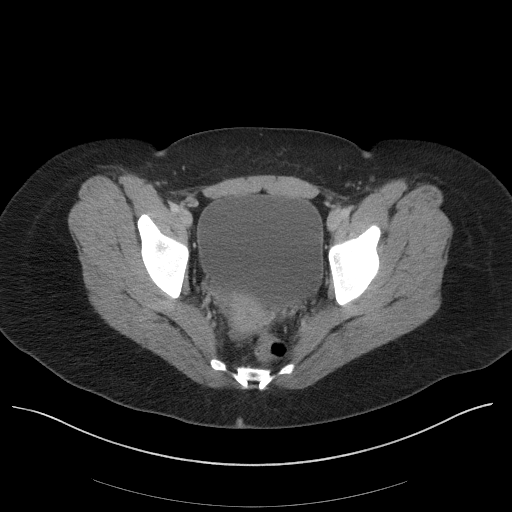
[im 26/98  soft-tissue]
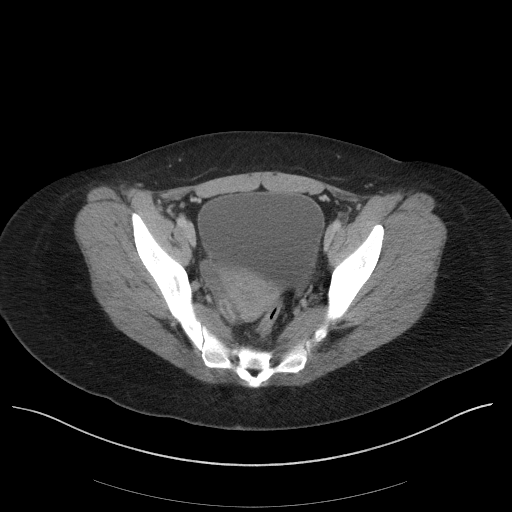
[im 34/98  soft-tissue]
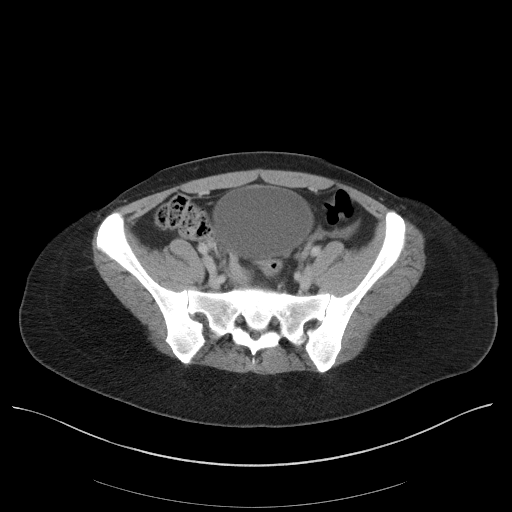
[im 43/98  soft-tissue]
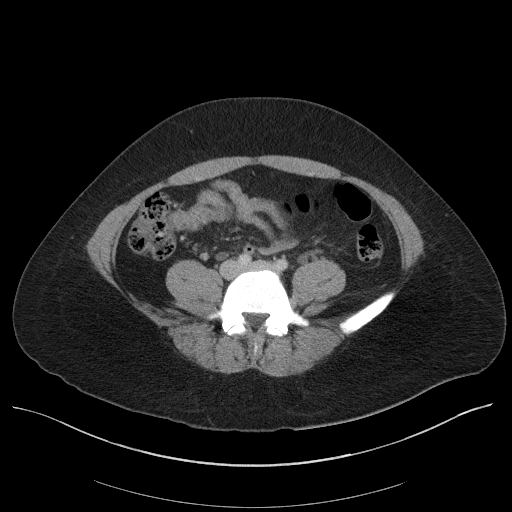
[im 51/98  soft-tissue]
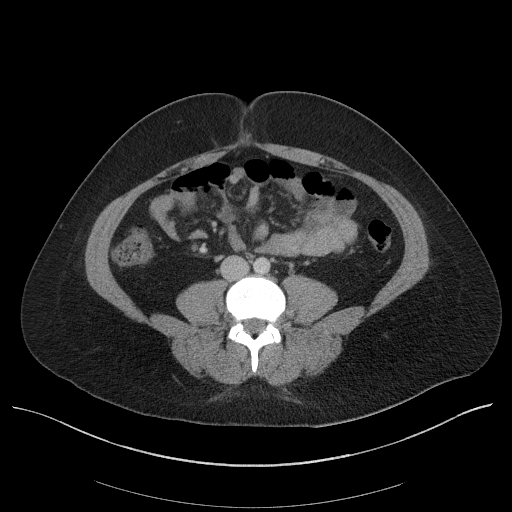
[im 55/98  soft-tissue]
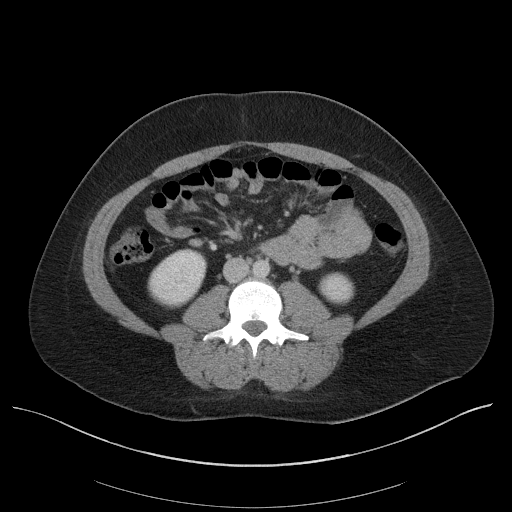
[im 64/98  soft-tissue]
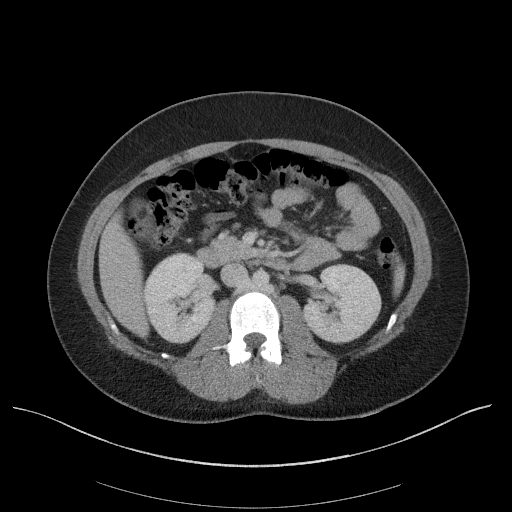
[im 64/98  bone]
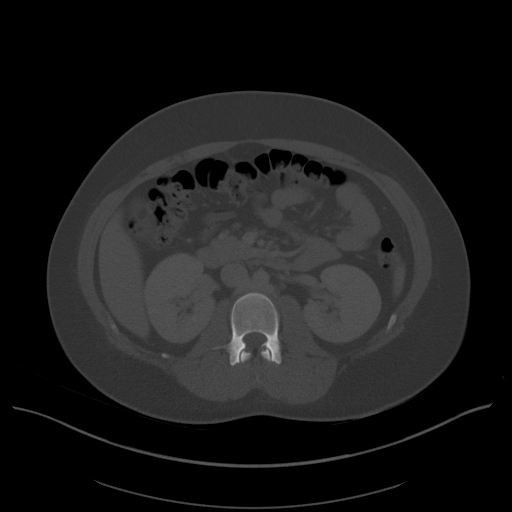
[im 72/98  soft-tissue]
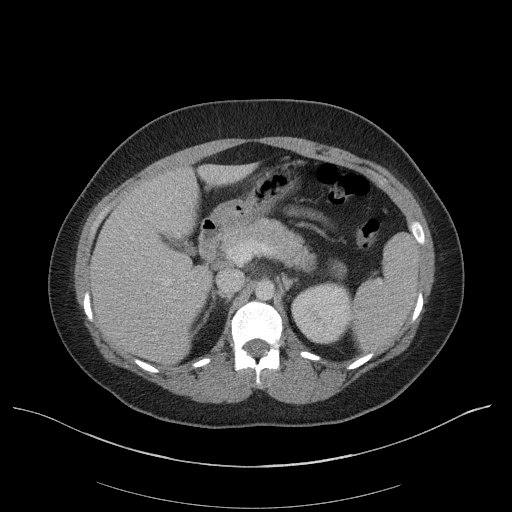
[im 76/98  soft-tissue]
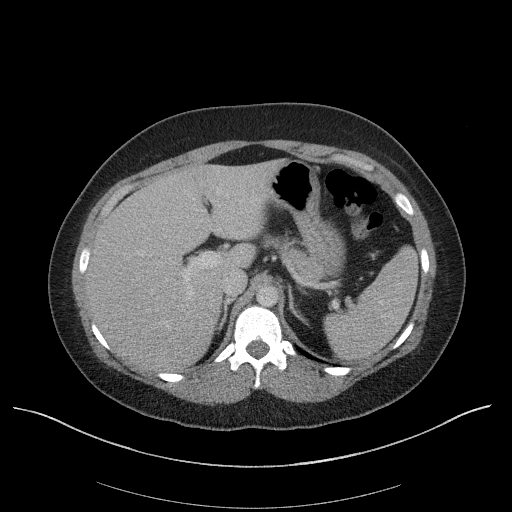
[im 85/98  soft-tissue]
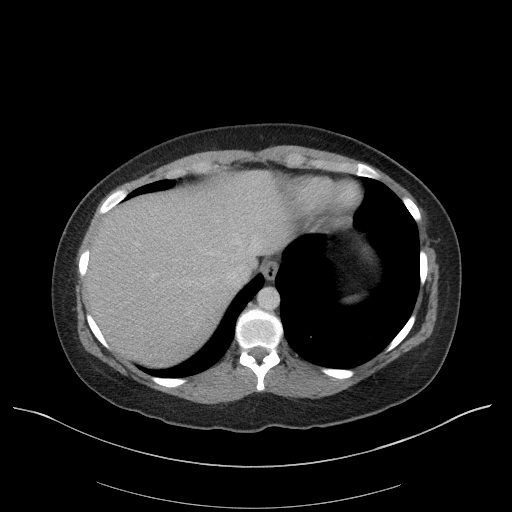
[im 93/98  soft-tissue]
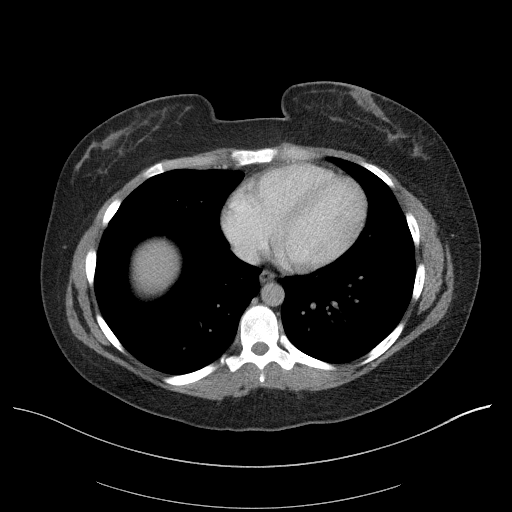

[Series 6: a/p w/ cor · coronal · 0.81mm/px · 3 of 132 slices shown]
[im 44/132  soft-tissue]
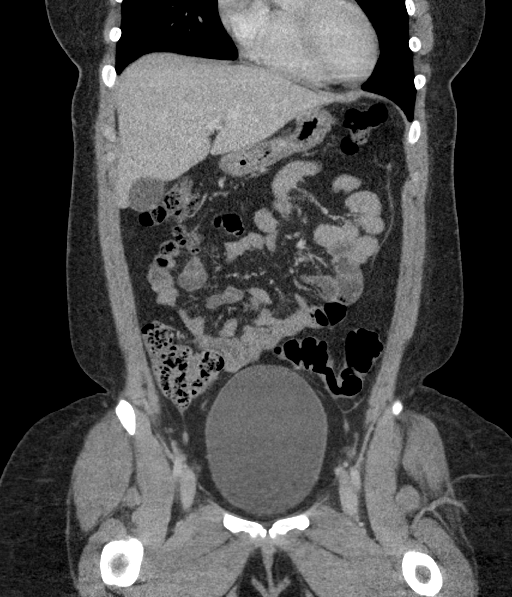
[im 59/132  soft-tissue]
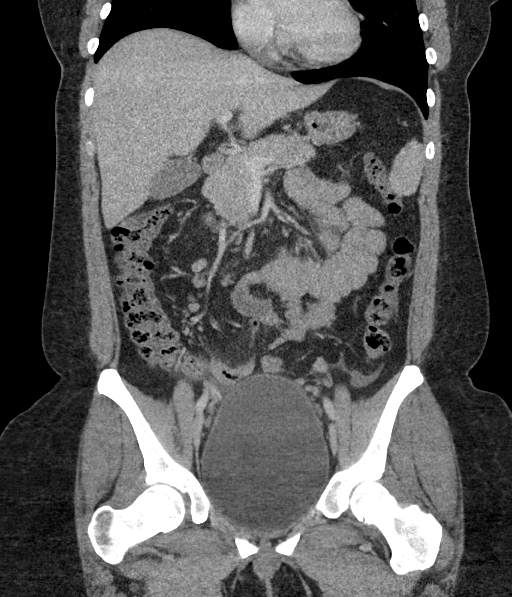
[im 73/132  soft-tissue]
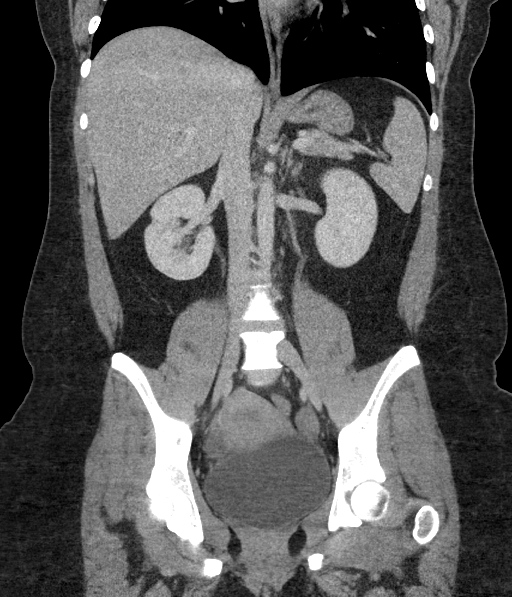

[16 of 46 positions shown; findings below may reference images not displayed]

FINDINGS: Lower chest: Lung bases are clear. No effusions. Heart is normal
size.

Hepatobiliary: No focal hepatic abnormality. Gallbladder
unremarkable.

Pancreas: No focal abnormality or ductal dilatation.

Spleen: No focal abnormality.  Normal size.

Adrenals/Urinary Tract: No adrenal abnormality. No focal renal
abnormality. No stones or hydronephrosis. Urinary bladder is
unremarkable.

Stomach/Bowel: There is gas noted within the appendix which is
typically a normal finding, but the appendix is mildly dilated at 13
mm and there appears to be early surrounding inflammation concerning
for early acute appendicitis. Stomach, large and small bowel grossly
unremarkable.

Vascular/Lymphatic: No evidence of aneurysm or adenopathy.
Incidentally noted is a circumaortic left renal vein.

Reproductive: Uterus and adnexa unremarkable. No mass. Small
collapsing right ovarian cyst or follicle.

Other: No free fluid or free air.

Musculoskeletal: No acute bony abnormality.
IMPRESSION: While there is air within the appendix, the appendix is mildly
dilated and there appears to be early surrounding
inflammation/stranding. Findings concerning for early acute
appendicitis.
